# Patient Record
Sex: Male | Born: 1955 | Race: White | Hispanic: No | Marital: Married | Smoking: Never smoker
Health system: Southern US, Community
[De-identification: ages and names within clinical notes are randomized; demographics above are authoritative.]

## PROBLEM LIST (undated history)

## (undated) DIAGNOSIS — E119 Type 2 diabetes mellitus without complications: Secondary | ICD-10-CM

## (undated) HISTORY — PX: NECK SURGERY: SHX720

## (undated) HISTORY — PX: BACK SURGERY: SHX140

## (undated) HISTORY — PX: APPENDECTOMY: SHX54

---

## 2020-11-11 ENCOUNTER — Emergency Department (HOSPITAL_COMMUNITY): Payer: BC Managed Care – PPO

## 2020-11-11 ENCOUNTER — Other Ambulatory Visit: Payer: Self-pay

## 2020-11-11 ENCOUNTER — Encounter (HOSPITAL_COMMUNITY): Payer: Self-pay | Admitting: Emergency Medicine

## 2020-11-11 ENCOUNTER — Emergency Department (HOSPITAL_COMMUNITY)
Admission: EM | Admit: 2020-11-11 | Discharge: 2020-11-11 | Disposition: A | Discharge status: Home / Self Care | Payer: BC Managed Care – PPO | Attending: Emergency Medicine | Admitting: Emergency Medicine

## 2020-11-11 DIAGNOSIS — S20229A Contusion of unspecified back wall of thorax, initial encounter: Secondary | ICD-10-CM

## 2020-11-11 DIAGNOSIS — E119 Type 2 diabetes mellitus without complications: Secondary | ICD-10-CM | POA: Diagnosis not present

## 2020-11-11 DIAGNOSIS — W19XXXA Unspecified fall, initial encounter: Secondary | ICD-10-CM

## 2020-11-11 DIAGNOSIS — S82842A Displaced bimalleolar fracture of left lower leg, initial encounter for closed fracture: Secondary | ICD-10-CM | POA: Insufficient documentation

## 2020-11-11 DIAGNOSIS — M546 Pain in thoracic spine: Secondary | ICD-10-CM | POA: Diagnosis not present

## 2020-11-11 DIAGNOSIS — W101XXA Fall (on)(from) sidewalk curb, initial encounter: Secondary | ICD-10-CM | POA: Diagnosis not present

## 2020-11-11 DIAGNOSIS — S99912A Unspecified injury of left ankle, initial encounter: Secondary | ICD-10-CM | POA: Diagnosis present

## 2020-11-11 HISTORY — DX: Type 2 diabetes mellitus without complications: E11.9

## 2020-11-11 MED ORDER — FENTANYL CITRATE (PF) 100 MCG/2ML IJ SOLN
50.0000 ug | Freq: Once | INTRAMUSCULAR | Status: AC
  Administered 2020-11-11: 50 ug via INTRAVENOUS
  Filled 2020-11-11: qty 2

## 2020-11-11 MED ORDER — PROPOFOL 10 MG/ML IV BOLUS
0.5000 mg/kg | Freq: Once | INTRAVENOUS | Status: AC
  Administered 2020-11-11: 61.3 mg via INTRAVENOUS
  Filled 2020-11-11: qty 20

## 2020-11-11 MED ORDER — SODIUM CHLORIDE 0.9 % IV SOLN
INTRAVENOUS | Status: AC | PRN
  Administered 2020-11-11: 1000 mL via INTRAVENOUS

## 2020-11-11 MED ORDER — HYDROCODONE-ACETAMINOPHEN 5-325 MG PO TABS: 1.0000 | ORAL_TABLET | ORAL | 0 refills | Status: AC | PRN

## 2020-11-11 NOTE — ED Provider Notes (Signed)
[facility] EMERGENCY DEPARTMENT Provider Note   CSN: [id number] Arrival date & time: 11/11/20  1660     History Chief Complaint  Patient presents with   Tony Conrad is a 65 y.o. male.  Patient presents to the emergency department for evaluation after a fall.  Patient reports that he stepped off a curb and twisted his ankle, causing him to fall to the ground.  No loss of consciousness.  Patient was unable to get up on his own.  He is complaining of left ankle pain, cannot bear weight.  He also has been having intermittent stabbing pains in the center of his back that radiate around the ribs.      Past Medical History:  Diagnosis Date   Diabetes mellitus without complication (HCC)     There are no problems to display for this patient.   Past Surgical History:  Procedure Laterality Date   APPENDECTOMY     BACK SURGERY     NECK SURGERY         No family history on file.  Social History   Tobacco Use   Smoking status: Never   Smokeless tobacco: Never  Substance Use Topics   Alcohol use: Yes    Comment: socially   Drug use: Not Currently    Home Medications Prior to Admission medications   Medication Sig Start Date End Date Taking? Authorizing Provider  HYDROcodone-acetaminophen (NORCO/VICODIN) 5-325 MG tablet Take 1 tablet by mouth every 4 (four) hours as needed for moderate pain. 11/11/20  Yes Jalilah Wiltsie, Canary Brim, MD    Allergies    Levaquin [levofloxacin]  Review of Systems   Review of Systems  Musculoskeletal:  Positive for arthralgias and back pain.  All other systems reviewed and are negative.  Physical Exam Updated Vital Signs BP 103/68   Pulse 94   Temp 97.8 F (36.6 C) (Oral)   Resp 16   Ht 6' (1.829 m)   Wt 122.5 kg   SpO2 94%   BMI 36.62 kg/m   Physical Exam Vitals and nursing note reviewed.  Constitutional:      General: He is not in acute distress.    Appearance: Normal appearance. He is well-developed.  HENT:      Head: Normocephalic and atraumatic.     Right Ear: Hearing normal.     Left Ear: Hearing normal.     Nose: Nose normal.  Eyes:     Conjunctiva/sclera: Conjunctivae normal.     Pupils: Pupils are equal, round, and reactive to light.  Cardiovascular:     Rate and Rhythm: Regular rhythm.     Heart sounds: S1 normal and S2 normal. No murmur heard.   No friction rub. No gallop.  Pulmonary:     Effort: Pulmonary effort is normal. No respiratory distress.     Breath sounds: Normal breath sounds.  Chest:     Chest wall: No tenderness.  Abdominal:     General: Bowel sounds are normal.     Palpations: Abdomen is soft.     Tenderness: There is no abdominal tenderness. There is no guarding or rebound. Negative signs include Murphy's sign and McBurney's sign.     Hernia: No hernia is present.  Musculoskeletal:     Cervical back: Normal range of motion and neck supple.     Left ankle: Swelling and deformity present. Decreased range of motion.  Skin:    General: Skin is warm and dry.     Findings:  No rash.  Neurological:     Mental Status: He is alert and oriented to person, place, and time.     GCS: GCS eye subscore is 4. GCS verbal subscore is 5. GCS motor subscore is 6.     Cranial Nerves: No cranial nerve deficit.     Sensory: No sensory deficit.     Coordination: Coordination normal.  Psychiatric:        Speech: Speech normal.        Behavior: Behavior normal.        Thought Content: Thought content normal.    ED Results / Procedures / Treatments   Labs (all labs ordered are listed, but only abnormal results are displayed) Labs Reviewed - No data to display  EKG None  Radiology DG Thoracic Spine 2 View  Result Date: 11/11/2020 CLINICAL DATA:  65 year old male status post fall.  Ankle fracture. EXAM: THORACIC SPINE 2 VIEWS COMPARISON:  Portable chest 0213 hours today. FINDINGS: Lower cervical ACDF. Cervicothoracic junction alignment is within normal limits. Thoracic spine  segmentation appears to be normal. Preserved thoracic kyphosis. No spondylolisthesis. Preserved thoracic vertebral height. Flowing anterior endplate osteophytes in the mid and lower thoracic spine. Relatively preserved disc spaces. Grossly intact posterior ribs. Negative visible thoracic visceral contours. IMPRESSION: No acute osseous abnormality identified in the thoracic spine. Electronically Signed   By: Odessa FlemingH  Hall M.D.   On: 11/11/2020 04:31   DG Lumbar Spine Complete  Result Date: 11/11/2020 CLINICAL DATA:  65 year old male status post fall. Ankle fracture. EXAM: LUMBAR SPINE - COMPLETE 4+ VIEW COMPARISON:  Thoracic spine radiographs today. FINDINGS: Normal lumbar segmentation, mildly hypoplastic ribs at T12. mild straightening of lumbar lordosis. Subtle anterolisthesis of L4 on L5. Disc space loss at that level and L5-S1. Moderate facet hypertrophy at those levels. Preserved disc spaces elsewhere. Evidence of interbody ankylosis at T12-L1 related to flowing anterior endplate osteophyte. No pars fracture. Grossly intact visible sacrum and SI joints. No acute osseous abnormality identified. Abundant bowel gas but nonobstructed visible bowel gas pattern. IMPRESSION: 1. No acute osseous abnormality identified in the lumbar spine. 2. Degenerative grade 1 anterolisthesis at L4-L5 with disc and facet degeneration at that level, L5-S1. Electronically Signed   By: Odessa FlemingH  Hall M.D.   On: 11/11/2020 04:33   DG Ankle 2 Views Left  Result Date: 11/11/2020 CLINICAL DATA:  Left ankle fracture dislocation, post reduction imaging EXAM: LEFT ANKLE - 2 VIEW COMPARISON:  11/11/2020 FINDINGS: Since the prior examination, the left ankle fracture dislocation has been reduced. Tibiotalar alignment is now normal. The posterior malleolar fracture fragment appears in near anatomic alignment on this limited examination. Distal fibular fracture fragment appears displaced medially and posteriorly by 2 cortical widths and demonstrates near  anatomic alignment. External immobilizer now in place. IMPRESSION: Successful left ankle reduction as described above. Electronically Signed   By: Helyn NumbersAshesh  Parikh MD   On: 11/11/2020 03:28   DG Pelvis Portable  Result Date: 11/11/2020 CLINICAL DATA:  Fall, left hip pain EXAM: PORTABLE PELVIS 1-2 VIEWS COMPARISON:  None. FINDINGS: There is no evidence of pelvic fracture or diastasis. No pelvic bone lesions are seen. IMPRESSION: Negative. Electronically Signed   By: Helyn NumbersAshesh  Parikh MD   On: 11/11/2020 02:35   DG Chest Port 1 View  Result Date: 11/11/2020 CLINICAL DATA:  Fall, bilateral rib pain EXAM: PORTABLE CHEST 1 VIEW COMPARISON:  None. FINDINGS: The heart size and mediastinal contours are within normal limits. Both lungs are clear. The visualized skeletal structures are  unremarkable. IMPRESSION: No active disease. Electronically Signed   By: Helyn Numbers MD   On: 11/11/2020 02:35   DG Ankle Left Port  Result Date: 11/11/2020 CLINICAL DATA:  Fall, left ankle deformity EXAM: PORTABLE LEFT ANKLE - 2 VIEW COMPARISON:  None. FINDINGS: Two view radiograph left ankle demonstrates a posterior fracture dislocation of the left ankle. There is a fracture of the posterior malleolus of the left ankle with superior displacement of the fracture fragment by 22 mm, posterior displacement by 12 mm, and mild posterior rotation of the a fracture fragment. The tibia is dislocated anteriorly and mildly overrides the talar dome. There is an associated oblique fracture of the distal fibular metaphysis extending to the levell of the tibial plafond and with roughly 2 cm posterior displacement and marked posterior angulation of the distal fracture fragment. IMPRESSION: Bimalleolar displaced, angulated fracture of the left ankle with superimposed posterior tibiotalar dislocation. Electronically Signed   By: Helyn Numbers MD   On: 11/11/2020 02:34    Procedures .Sedation  Date/Time: 11/11/2020 2:41 AM Performed by: Gilda Crease, MD Authorized by: Gilda Crease, MD   Consent:    Consent obtained:  Written   Consent given by:  Patient   Risks discussed:  Prolonged hypoxia resulting in organ damage, respiratory compromise necessitating ventilatory assistance and intubation, inadequate sedation, nausea and vomiting Universal protocol:    Procedure explained and questions answered to patient or proxy's satisfaction: yes     Relevant documents present and verified: yes     Test results available: yes     Imaging studies available: yes     Required blood products, implants, devices, and special equipment available: yes     Site/side marked: yes     Immediately prior to procedure, a time out was called: yes     Patient identity confirmed:  Verbally with patient Indications:    Procedure performed:  Fracture reduction   Procedure necessitating sedation performed by:  Physician performing sedation Pre-sedation assessment:    Time since last food or drink:  10   ASA classification: class 2 - patient with mild systemic disease     Mouth opening:  3 or more finger widths   Thyromental distance:  3 finger widths   Mallampati score:  II - soft palate, uvula, fauces visible   Neck mobility: normal     Pre-sedation assessments completed and reviewed: airway patency, cardiovascular function, hydration status, mental status, nausea/vomiting, pain level, respiratory function and temperature   Immediate pre-procedure details:    Reviewed: vital signs, relevant labs/tests and NPO status     Verified: bag valve mask available, emergency equipment available, intubation equipment available, IV patency confirmed, oxygen available and suction available   Procedure details (see MAR for exact dosages):    Preoxygenation:  Nasal cannula   Sedation:  Propofol   Intended level of sedation: deep and moderate (conscious sedation)   Analgesia:  None   Intra-procedure monitoring:  Blood pressure monitoring, continuous  capnometry, frequent LOC assessments, frequent vital sign checks, continuous pulse oximetry and cardiac monitor   Intra-procedure events: none     Total Provider sedation time (minutes):  15 Post-procedure details:    Attendance: Constant attendance by certified staff until patient recovered     Recovery: Patient returned to pre-procedure baseline     Post-sedation assessments completed and reviewed: airway patency, cardiovascular function, hydration status, mental status, nausea/vomiting, pain level, respiratory function and temperature     Patient is stable for  discharge or admission: yes     Procedure completion:  Tolerated well, no immediate complications .Ortho Injury Treatment  Date/Time: 11/11/2020 2:52 AM Performed by: Gilda Crease, MD Authorized by: Gilda Crease, MD   Consent:    Consent obtained:  Written   Consent given by:  Patient   Risks discussed:  Recurrent dislocation, irreducible dislocation and nerve damage Universal protocol:    Procedure explained and questions answered to patient or proxy's satisfaction: yes     Relevant documents present and verified: yes     Test results available and properly labeled: yes     Imaging studies available: yes     Required blood products, implants, devices, and special equipment available: yes     Site/side marked: yes     Immediately prior to procedure a time out was called: yes     Patient identity confirmed:  Verbally with patientInjury location: ankle Injury type: fracture-dislocation Fracture type: trimalleolar Pre-procedure distal perfusion: diminished Pre-procedure neurological function: normal Pre-procedure range of motion: reduced  Anesthesia: Local anesthesia used: no  Patient sedated: Yes. Refer to sedation procedure documentation for details of sedation. Manipulation performed: yes Skeletal traction used: yes Reduction successful: yes X-ray confirmed reduction: yes Immobilization:  splint Splint type: ankle stirrup and short leg Splint Applied by: ED Nurse Supplies used: Ortho-Glass Post-procedure neurovascular assessment: post-procedure neurovascularly intact Post-procedure distal perfusion: normal Post-procedure neurological function: normal     Medications Ordered in ED Medications  propofol (DIPRIVAN) 10 mg/mL bolus/IV push 61.3 mg (61.3 mg Intravenous Given 11/11/20 0229)  0.9 %  sodium chloride infusion (1,000 mLs Intravenous New Bag/Given 11/11/20 0233)  fentaNYL (SUBLIMAZE) injection 50 mcg (50 mcg Intravenous Given 11/11/20 0254)    ED Course  I have reviewed the triage vital signs and the nursing notes.  Pertinent labs & imaging results that were available during my care of the patient were reviewed by me and considered in my medical decision making (see chart for details).    MDM Rules/Calculators/A&P                           Patient presents to the emergency department for evaluation of ankle injury after a fall.  Patient had obvious deformity of the left ankle upon arrival.  X-ray confirmed fracture dislocation.  Patient underwent procedural sedation with propofol and then had a closed reduction.  Post reduction x-ray shows good alignment.  Patient is neurovascularly intact.  No skin breakdown.  Patient also complained of pain in the mid back.  He was having paroxysms of pain that were consistent with muscle spasm.  X-rays negative.  Patient did have a period of hypotension and mild hypoxia after administration of fentanyl for breakthrough pain.  He has been monitored here for a number of hours and is now awake, alert with normal vital signs.  Dr. Romeo Apple was informed of the patient's fracture and will see the patient in his office for follow-up. Final Clinical Impression(s) / ED Diagnoses Final diagnoses:  Fall  Closed bimalleolar fracture of left ankle, initial encounter    Rx / DC Orders ED Discharge Orders          Ordered     HYDROcodone-acetaminophen (NORCO/VICODIN) 5-325 MG tablet  Every 4 hours PRN        11/11/20 0452             Gilda Crease, MD 11/12/20 [phone]

## 2020-11-11 NOTE — Sedation Documentation (Signed)
Pt alert and oriented at this time.

## 2020-11-11 NOTE — ED Triage Notes (Signed)
Pt here by [facility] after a fall twisting his left ankle tonight; pt c/o bilateral rib pain and back pain; EDP at bedside for [facility]

## 2020-11-11 NOTE — ED Notes (Signed)
Patient transported to CT 

## 2020-11-11 NOTE — ED Notes (Signed)
Paged Tony Conrad

## 2022-11-03 IMAGING — DX DG ANKLE 2V *L*
2 series · 2 of 2 positions shown · non-contrast
Comparison: 11/11/2020

CLINICAL DATA: Left ankle fracture dislocation, post reduction
imaging

EXAM:
LEFT ANKLE - 2 VIEW

[ankle ap]
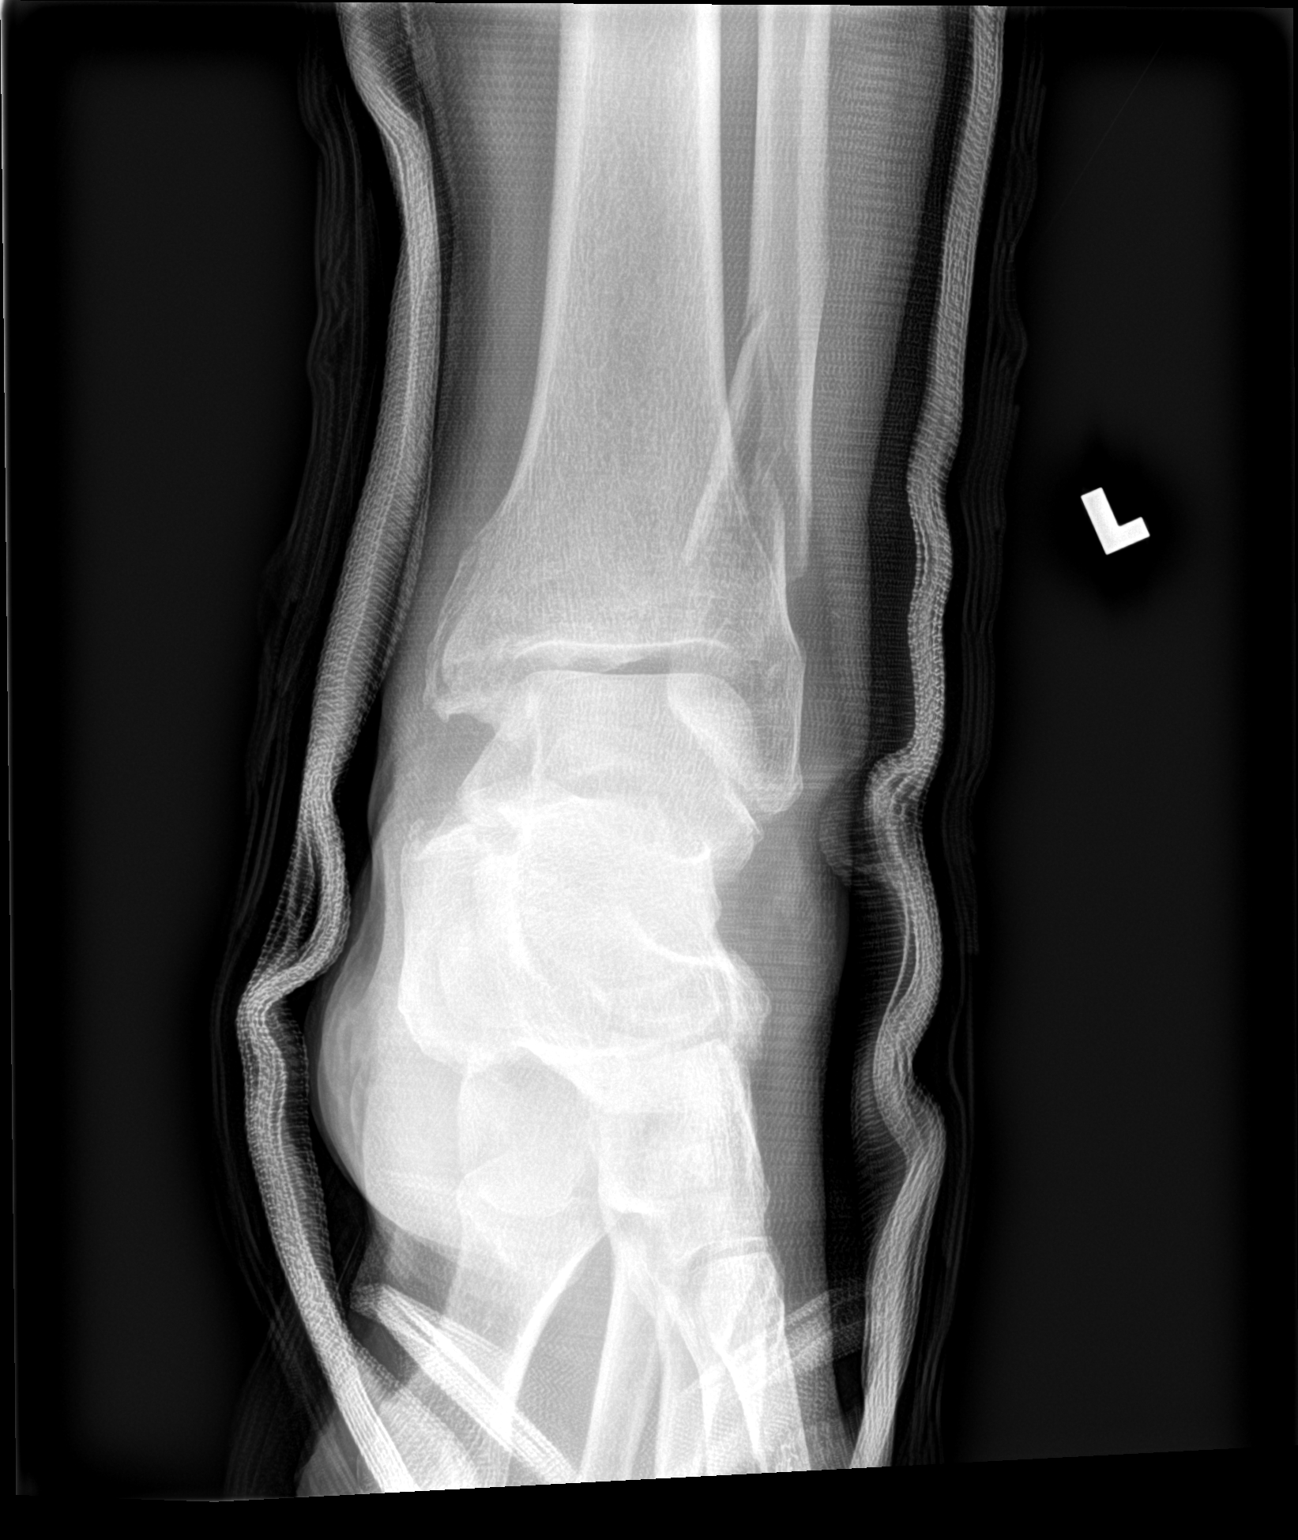

[ankle lat]
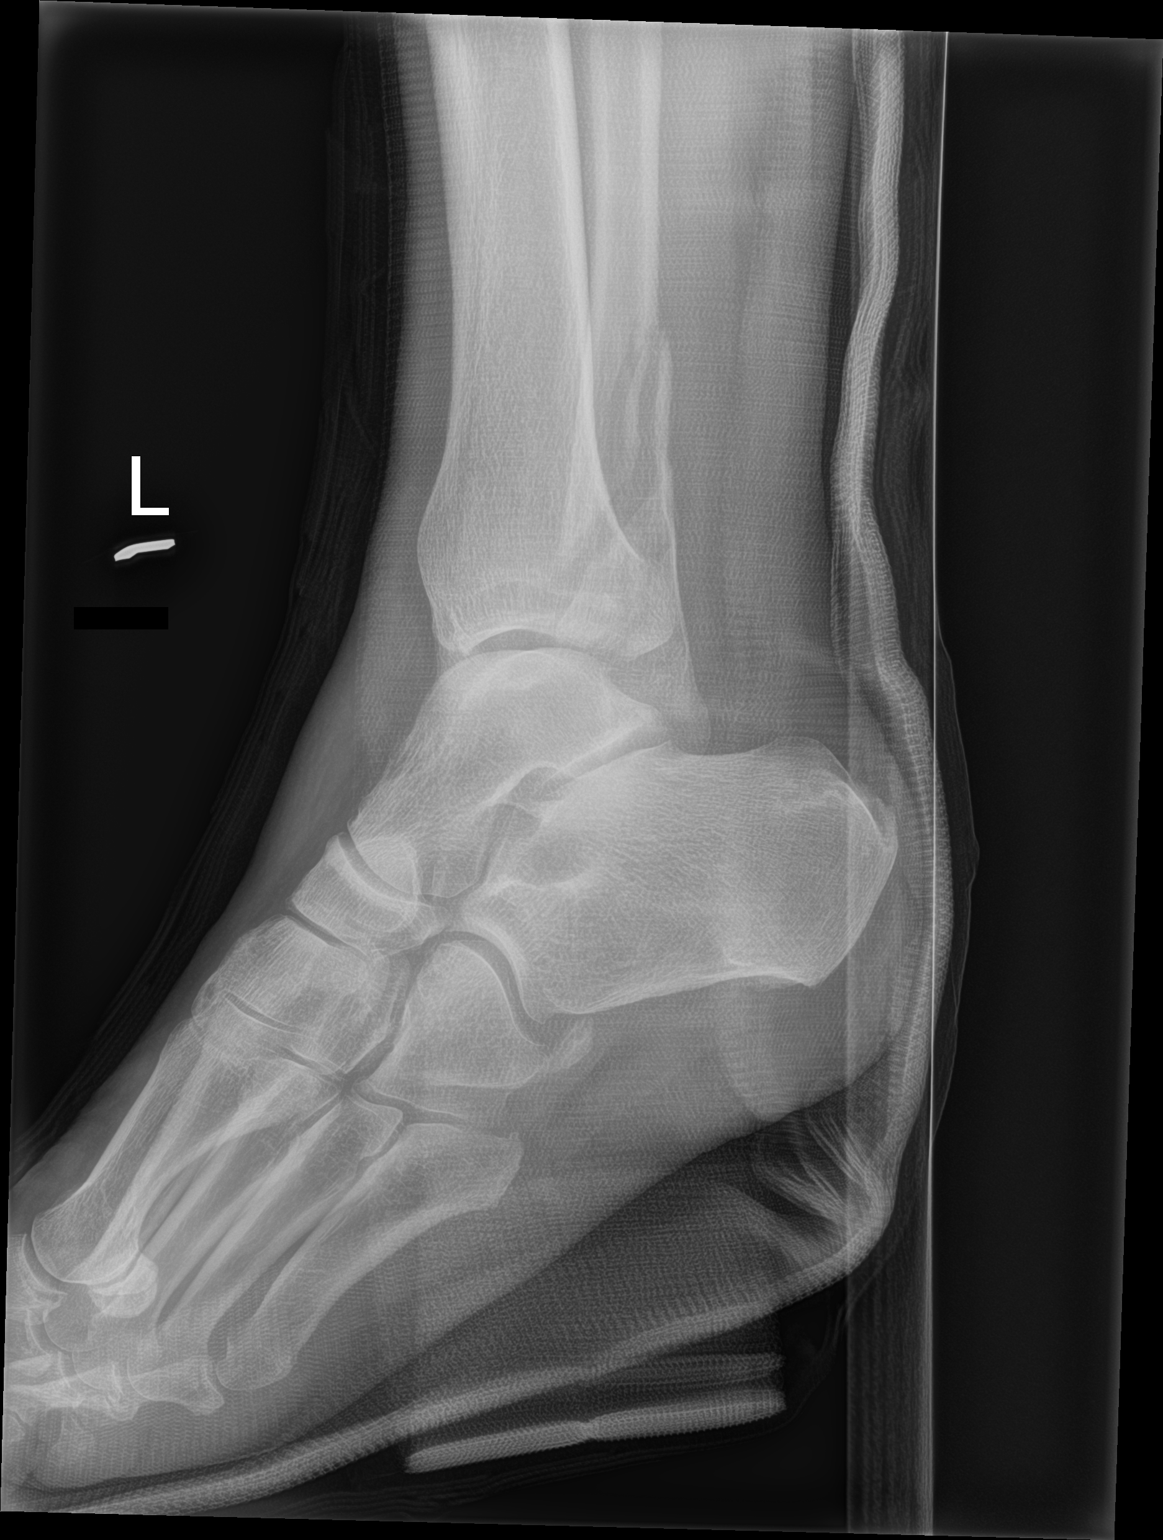

[2 of 2 positions shown; findings below may reference images not displayed]

FINDINGS: Since the prior examination, the left ankle fracture dislocation has
been reduced. Tibiotalar alignment is now normal. The posterior
malleolar fracture fragment appears in near anatomic alignment on
this limited examination. Distal fibular fracture fragment appears
displaced medially and posteriorly by 2 cortical widths and
demonstrates near anatomic alignment. External immobilizer now in
place.
IMPRESSION: Successful left ankle reduction as described above.

## 2022-11-03 IMAGING — DX DG THORACIC SPINE 2V
4 series · 4 of 4 positions shown · non-contrast
Comparison: Portable chest 2886 hours today.

CLINICAL DATA: 64-year-old male status post fall.  Ankle fracture.

EXAM:
THORACIC SPINE 2 VIEWS

[t-spine ap]
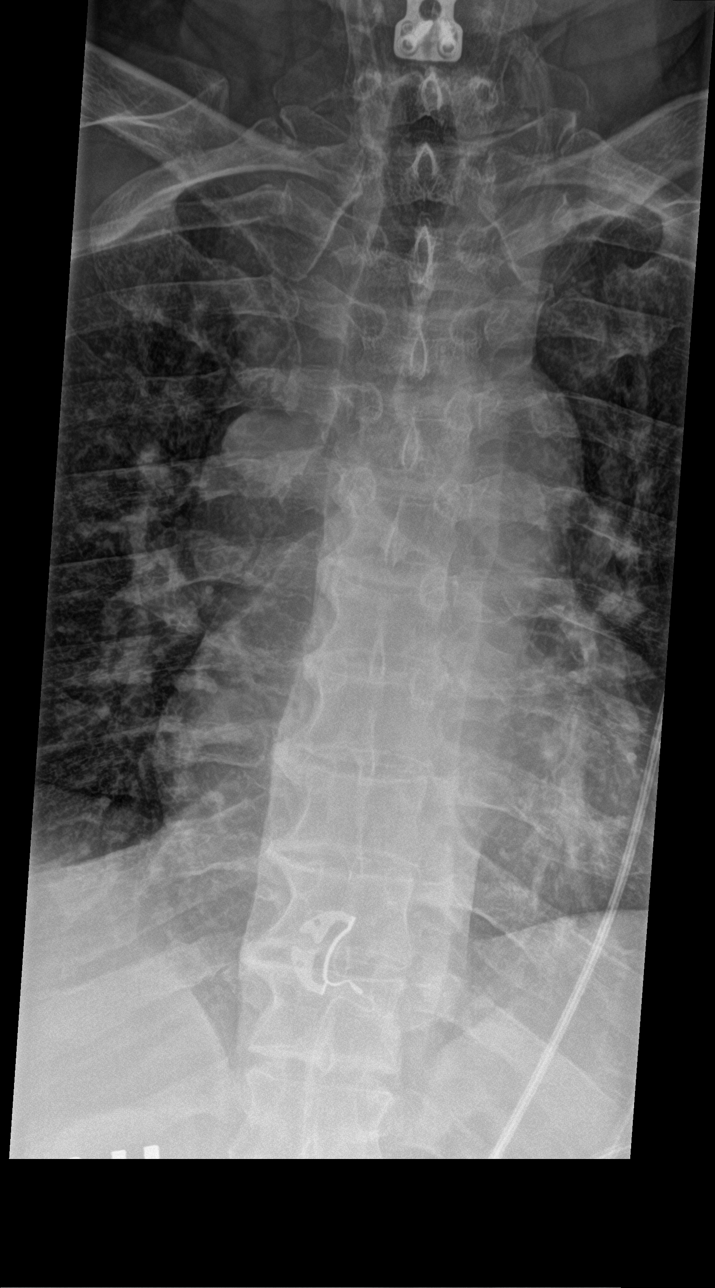

[t-spine lat (1 of 2)]
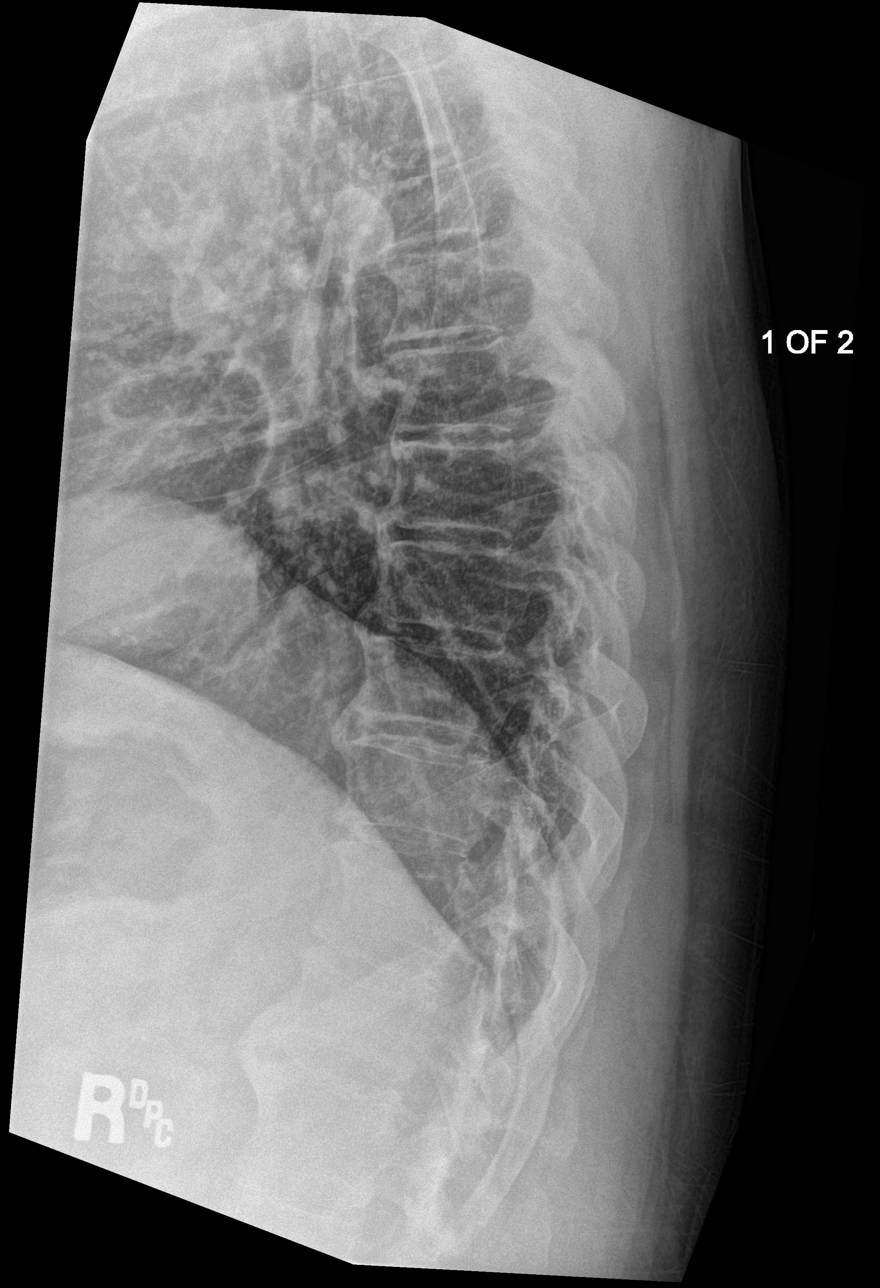

[t-spine swimmers]
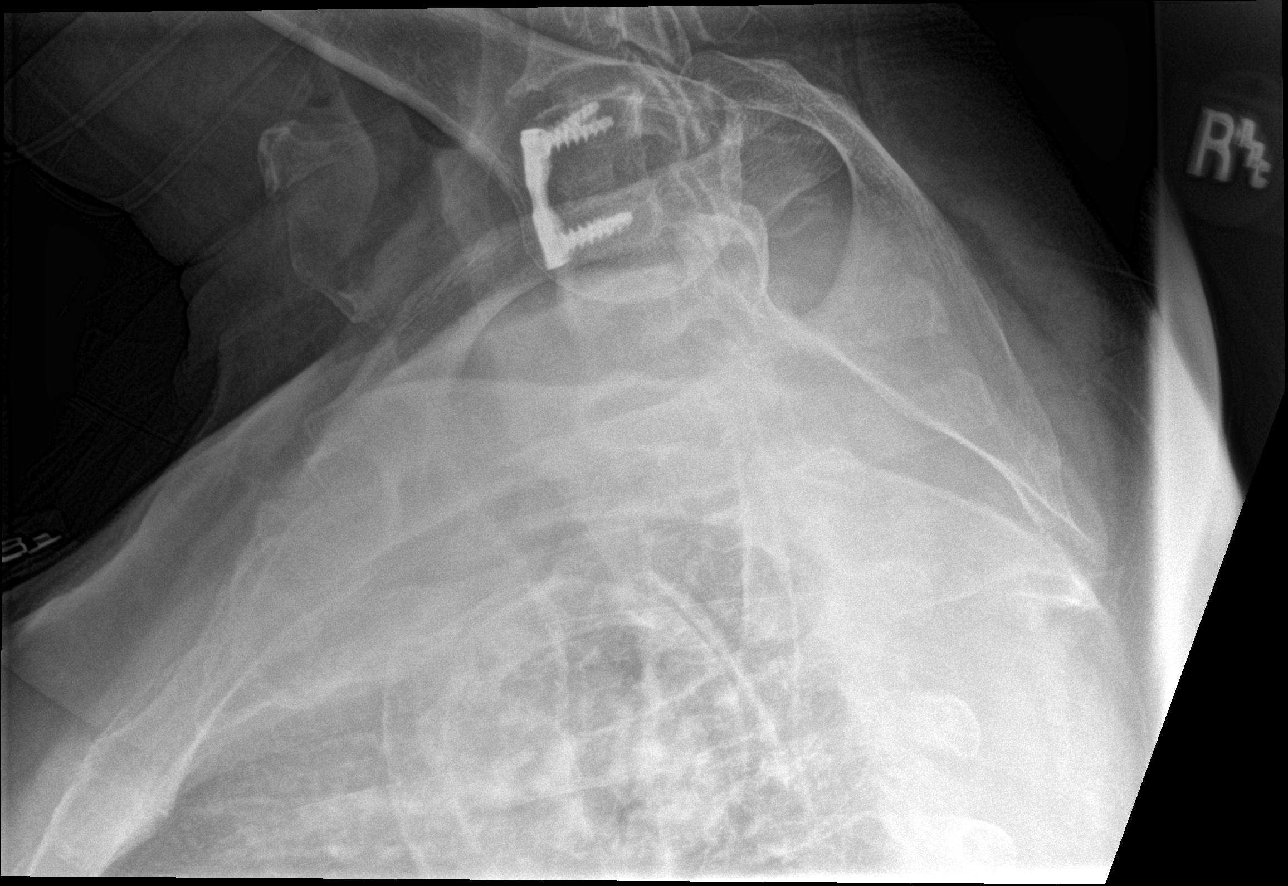

[t-spine lat (2 of 2)]
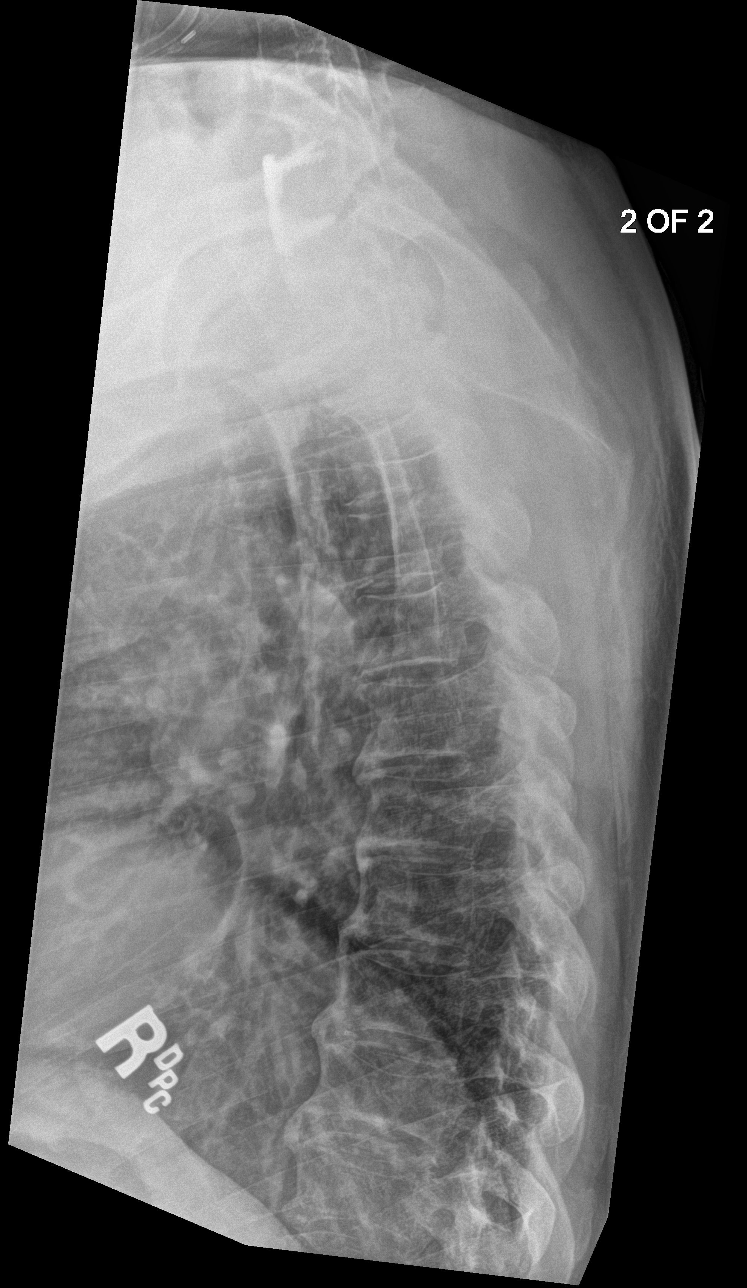

[4 of 4 positions shown; findings below may reference images not displayed]

FINDINGS: Lower cervical ACDF. Cervicothoracic junction alignment is within
normal limits. Thoracic spine segmentation appears to be normal.
Preserved thoracic kyphosis. No spondylolisthesis. Preserved
thoracic vertebral height. Flowing anterior endplate osteophytes in
the mid and lower thoracic spine. Relatively preserved disc spaces.
Grossly intact posterior ribs. Negative visible thoracic visceral
contours.
IMPRESSION: No acute osseous abnormality identified in the thoracic spine.
